# Patient Record
Sex: Female | Born: 1945 | Race: White | Hispanic: No | Smoking: Current every day smoker
Health system: Southern US, Community
[De-identification: ages and names within clinical notes are randomized; demographics above are authoritative.]

---

## 2000-12-01 ENCOUNTER — Encounter: Payer: Self-pay | Admitting: Family Medicine

## 2000-12-01 ENCOUNTER — Encounter: Admission: RE | Admit: 2000-12-01 | Discharge: 2000-12-01 | Payer: Self-pay | Admitting: Family Medicine

## 2001-12-04 ENCOUNTER — Encounter: Payer: Self-pay | Admitting: Family Medicine

## 2001-12-04 ENCOUNTER — Encounter: Admission: RE | Admit: 2001-12-04 | Discharge: 2001-12-04 | Payer: Self-pay | Admitting: Family Medicine

## 2003-02-28 ENCOUNTER — Encounter: Admission: RE | Admit: 2003-02-28 | Discharge: 2003-02-28 | Payer: Self-pay | Admitting: Family Medicine

## 2003-02-28 ENCOUNTER — Encounter: Payer: Self-pay | Admitting: Family Medicine

## 2004-09-15 ENCOUNTER — Encounter: Admission: RE | Admit: 2004-09-15 | Discharge: 2004-09-15 | Payer: Self-pay | Admitting: Family Medicine

## 2004-09-17 ENCOUNTER — Ambulatory Visit: Payer: Self-pay | Admitting: Family Medicine

## 2004-11-19 ENCOUNTER — Ambulatory Visit: Payer: Self-pay | Admitting: Family Medicine

## 2004-12-14 ENCOUNTER — Ambulatory Visit (HOSPITAL_COMMUNITY): Admission: RE | Admit: 2004-12-14 | Discharge: 2004-12-14 | Payer: Self-pay | Admitting: Gastroenterology

## 2004-12-14 ENCOUNTER — Encounter (INDEPENDENT_AMBULATORY_CARE_PROVIDER_SITE_OTHER): Payer: Self-pay | Admitting: Specialist

## 2005-01-26 ENCOUNTER — Ambulatory Visit: Payer: Self-pay | Admitting: Family Medicine

## 2005-07-19 ENCOUNTER — Ambulatory Visit: Payer: Self-pay | Admitting: Family Medicine

## 2008-02-08 ENCOUNTER — Encounter: Admission: RE | Admit: 2008-02-08 | Discharge: 2008-02-08 | Payer: Self-pay | Admitting: Family Medicine

## 2010-01-15 ENCOUNTER — Ambulatory Visit (HOSPITAL_COMMUNITY): Admission: RE | Admit: 2010-01-15 | Discharge: 2010-01-15 | Payer: Self-pay | Admitting: Family Medicine

## 2011-01-29 NOTE — Op Note (Signed)
NAMENAOMIA, LENDERMAN NO.:  [id number]   MEDICAL RECORD NO.:  [id number]          PATIENT TYPE:  AMB   LOCATION:  ENDO                         FACILITY:  [facility]   PHYSICIAN:  Petra Kuba, M.D.    DATE OF BIRTH:  02-Feb-1946   DATE OF PROCEDURE:  12/14/2004  DATE OF DISCHARGE:                                 OPERATIVE REPORT   PROCEDURE PERFORMED:  Colonoscopy with polypectomy.   ENDOSCOPIST:  Petra Kuba, M.D.   INDICATIONS FOR PROCEDURE:  Screening.   Consent was signed after the risks, benefits, methods and options were  thoroughly discussed in the office.   MEDICINES USED:  Demerol 75 mg, Versed 7.5 mg.   DESCRIPTION OF PROCEDURE:  Rectal inspection was pertinent for external  hemorrhoids, small.  Digital exam was negative.  A video pediatric  adjustable colonoscope was inserted and with rolling her on her back and  abdominal pressure, fairly easily able to advance around the colon to the  cecum.  On insertion a mid sized, midsigmoid, moderate sized diverticula  were seen.  A midsigmoid moderate sized pedunculated polyp was seen but no  other abnormalities.  The cecum was identified by the appendiceal orifice  and the ileocecal valve.  The scope was slowly withdrawn.  The prep was  adequate.  There was some liquid stool that required washing and suctioning.  On slow withdrawal back through the colon, the cecum, ascending, transverse  and descending were normal.  The scope was slowly withdrawn back to the  polyp seen on insertion which was snared, electrocautery applied.  Polyp was  removed, resnared suctioned onto the head of the scope and the polyp was  recovered.  Scope was reinserted and advanced to the polypectomy site.  It  had a nice white coagulum without any obvious residual polypoid tissue. The  scope was slowly withdrawn.  No additional findings were seen as we slowly  withdrew back to the rectum.  Anorectal pull through and retroflexion  confirmed some small hemorrhoids.  Scope was straightened, readvanced a  short ways up the left side of the colon, air was suctioned, scope removed.  The patient tolerated the procedure well.  There was no immediate obvious  complication.   ENDOSCOPIC DIAGNOSIS:  1.  Internal and external hemorrhoids.  2.  Midsigmoid moderate sized, pedunculated polyp, status post snare.  3.  Otherwise within normal limits to the cecum.   PLAN:  Await pathology to determine the future of colonic screening but  probably recheck in three years.  Happy to see back p.r.n.  Otherwise return  care to Dr. Lysbeth Galas for the customary health care maintenance to include  yearly rectals and guaiacs.      MEM/MEDQ  D:  12/14/2004  T:  12/14/2004  Job:  [id number]   cc:   Delaney Meigs, M.D.  [address]  [place]  [place] [address]  Fax: [phone]

## 2011-11-18 IMAGING — US US ABDOMEN COMPLETE
2 series · 14 of 25 positions shown · non-contrast
Comparison: None

CLINICAL DATA: Right upper quadrant pain.

COMPLETE ABDOMINAL ULTRASOUND

[Series 1: us abdomen complete · 0.27mm/px · 11 of 57 slices shown (1 of 2)]
[im 1/57]
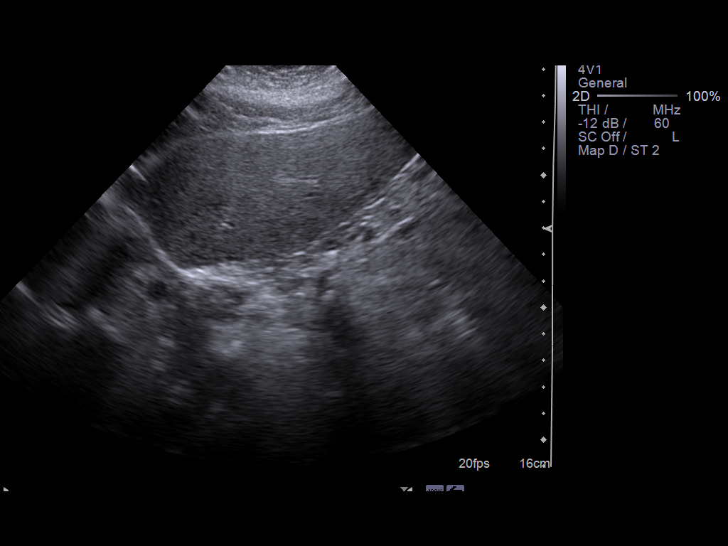
[im 6/57]
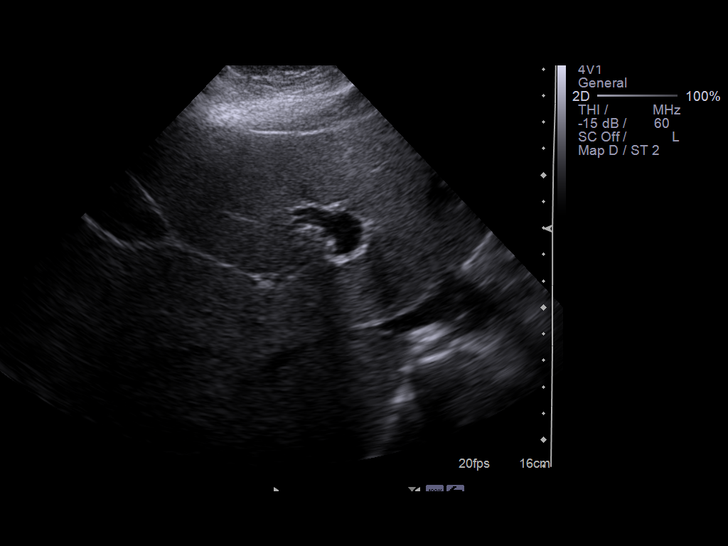
[im 12/57]
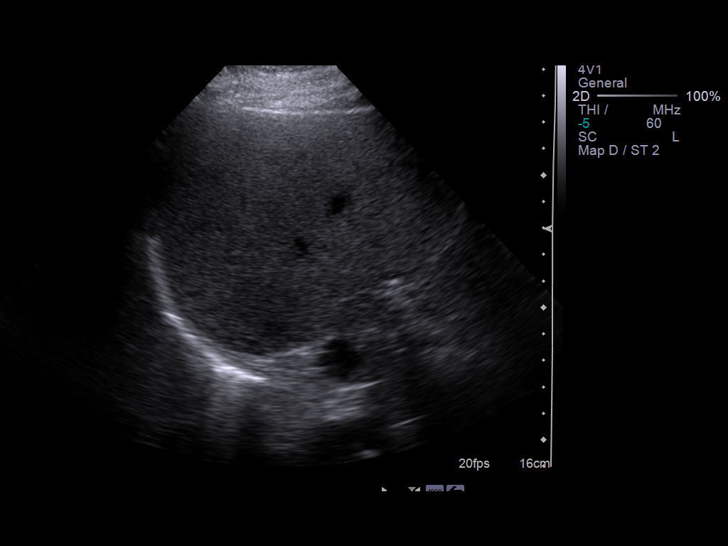
[im 18/57]
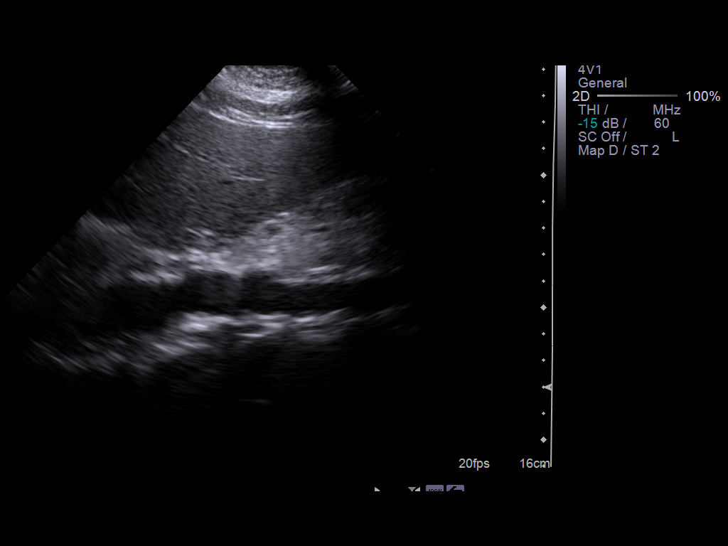
[im 24/57]
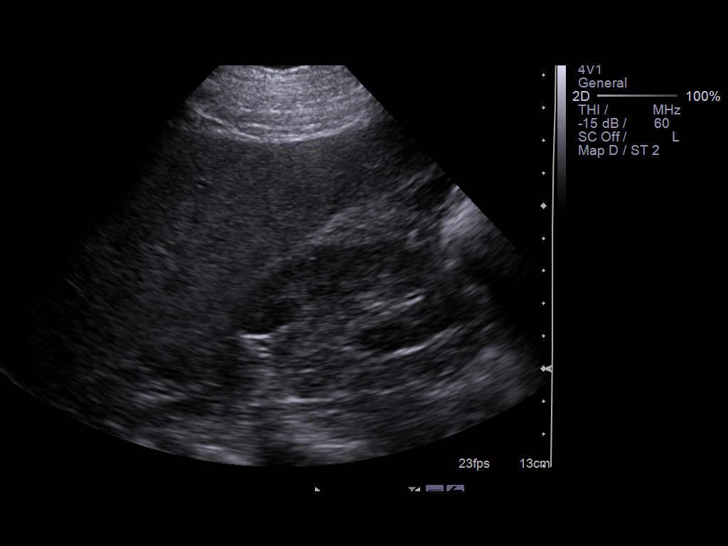
[im 27/57]
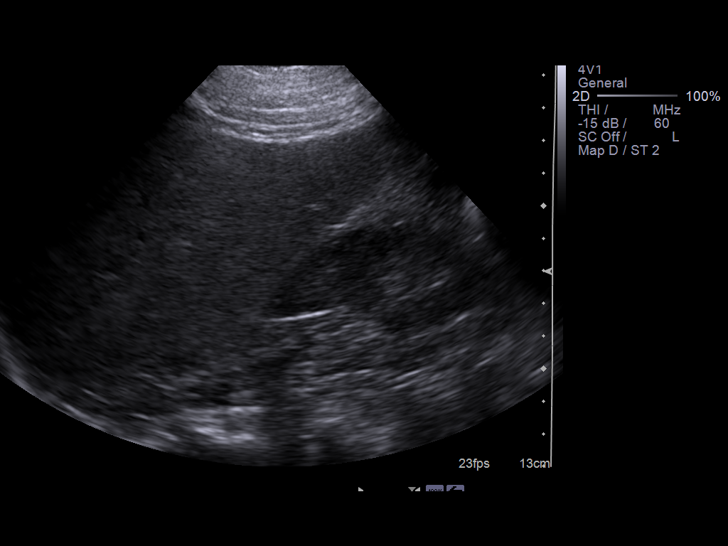
[im 33/57]
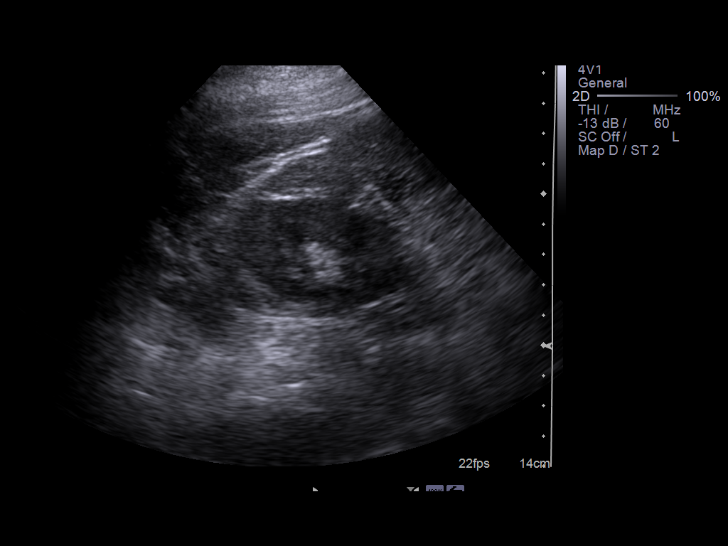
[im 39/57]
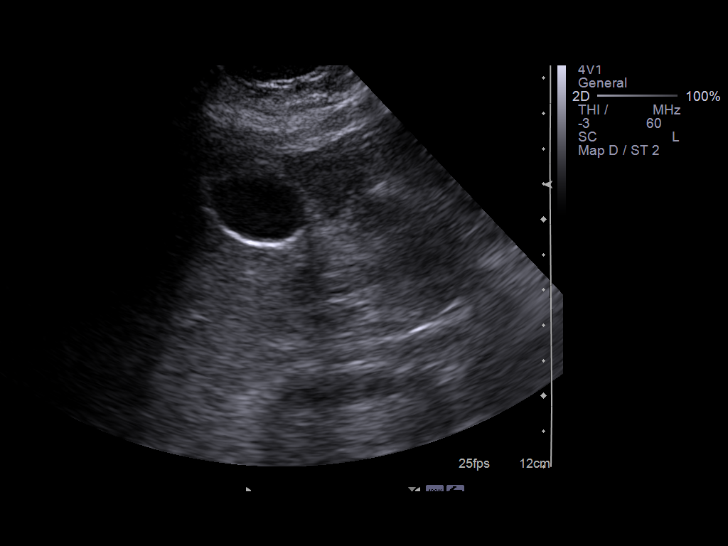
[im 45/57]
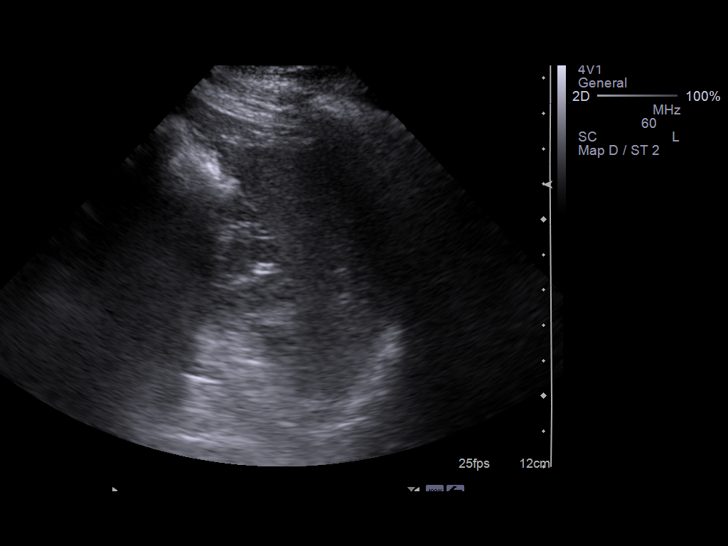
[im 48/57]
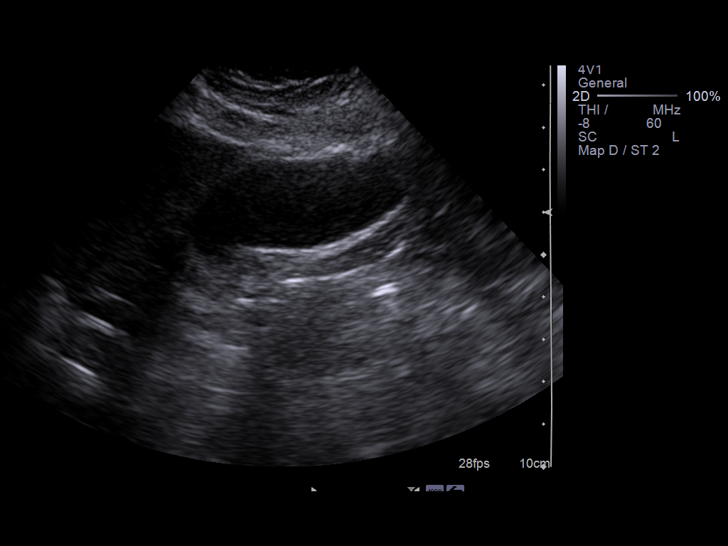
[im 54/57]
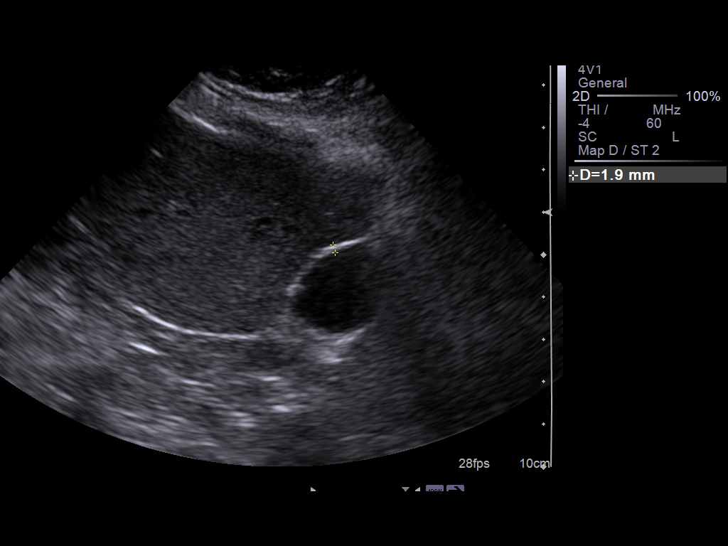

[Series 2: us abdomen complete · 0.28mm/px · 3 of 14 slices shown (2 of 2)]
[im 1/14]
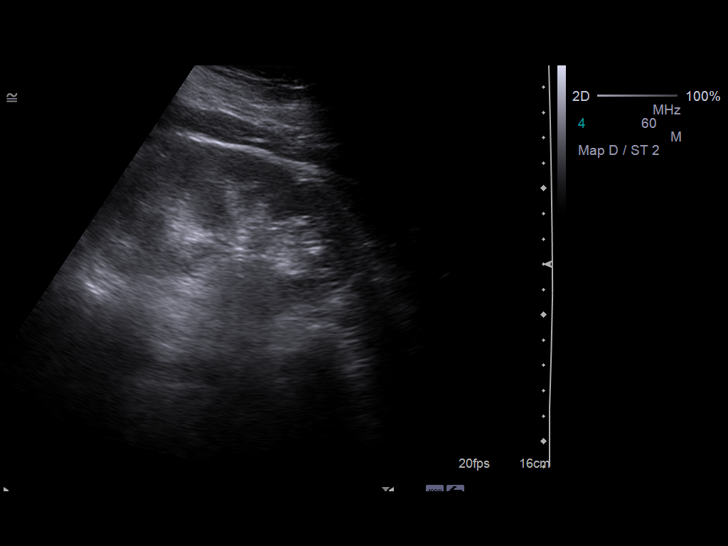
[im 7/14]
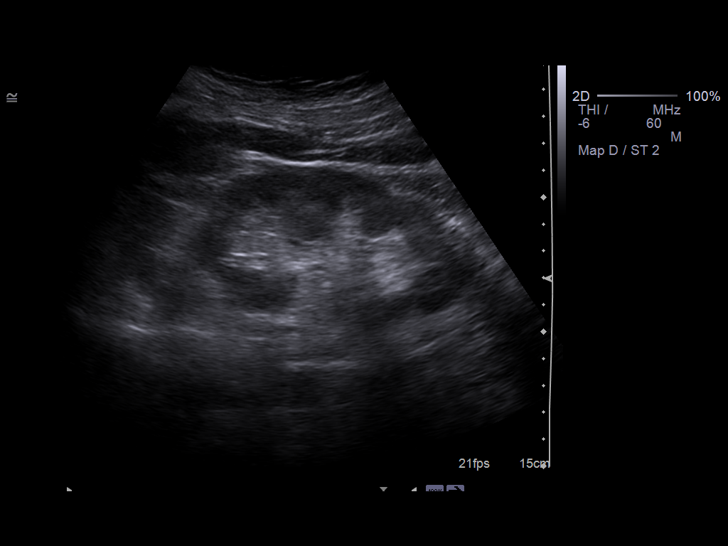
[im 14/14]
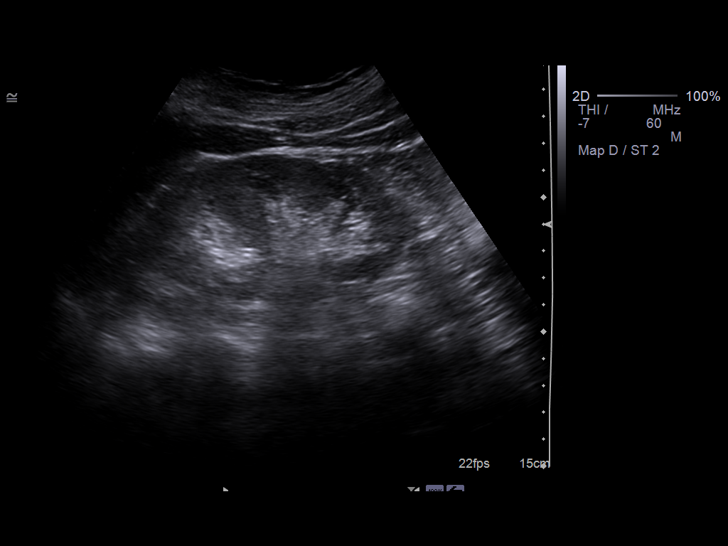

[14 of 25 positions shown; findings below may reference images not displayed]

FINDINGS: Gallbladder:  No gallstones, gallbladder wall thickening, or
pericholecystic fluid.

Common bile duct:  3.9 mm

Liver:  No focal lesion identified.  Within normal limits in
parenchymal echogenicity.

IVC:  Appears normal.

Pancreas:  No focal abnormality seen.

Spleen:  5.2 cm in length

Right Kidney:  11.1 cm in length without obstruction or mass.

Left Kidney:  11.2 cm in length without obstruction or mass.

Abdominal aorta:  No aneurysm identified.
IMPRESSION: Negative abdominal ultrasound.

## 2012-08-29 ENCOUNTER — Other Ambulatory Visit: Payer: Self-pay | Admitting: Family Medicine

## 2012-08-29 DIAGNOSIS — Z1231 Encounter for screening mammogram for malignant neoplasm of breast: Secondary | ICD-10-CM

## 2012-08-31 ENCOUNTER — Other Ambulatory Visit: Payer: Self-pay | Admitting: Family Medicine

## 2012-08-31 DIAGNOSIS — N63 Unspecified lump in unspecified breast: Secondary | ICD-10-CM

## 2012-08-31 DIAGNOSIS — N644 Mastodynia: Secondary | ICD-10-CM

## 2012-09-18 ENCOUNTER — Ambulatory Visit
Admission: RE | Admit: 2012-09-18 | Discharge: 2012-09-18 | Disposition: A | Discharge status: Home / Self Care | Payer: Medicare Other | Source: Ambulatory Visit | Attending: Family Medicine | Admitting: Family Medicine

## 2012-09-18 DIAGNOSIS — N644 Mastodynia: Secondary | ICD-10-CM

## 2012-09-18 DIAGNOSIS — N63 Unspecified lump in unspecified breast: Secondary | ICD-10-CM

## 2013-08-22 ENCOUNTER — Encounter: Payer: Self-pay | Admitting: Hematology & Oncology

## 2013-09-11 ENCOUNTER — Ambulatory Visit (HOSPITAL_COMMUNITY): Payer: Medicare Other

## 2013-09-24 ENCOUNTER — Telehealth: Payer: Self-pay | Admitting: Hematology & Oncology

## 2013-09-24 ENCOUNTER — Other Ambulatory Visit: Payer: Self-pay | Admitting: *Deleted

## 2013-09-24 NOTE — Telephone Encounter (Signed)
Left vm w NEW PATIENT today to remind them of their appointment with Dr. Ennever. Also, advised them to bring all meds and insurance information. ° °

## 2013-09-25 ENCOUNTER — Encounter: Payer: Self-pay | Admitting: Hematology & Oncology

## 2013-09-25 ENCOUNTER — Ambulatory Visit: Payer: Medicare Other

## 2013-09-25 ENCOUNTER — Ambulatory Visit (HOSPITAL_BASED_OUTPATIENT_CLINIC_OR_DEPARTMENT_OTHER): Payer: Medicare Other | Admitting: Hematology & Oncology

## 2013-09-25 ENCOUNTER — Other Ambulatory Visit (HOSPITAL_BASED_OUTPATIENT_CLINIC_OR_DEPARTMENT_OTHER): Payer: Medicare Other | Admitting: Lab

## 2013-09-25 VITALS — BP 122/57 | HR 80 | Temp 98.1°F | Resp 14 | Ht 60.0 in | Wt 167.0 lb

## 2013-09-25 DIAGNOSIS — D72829 Elevated white blood cell count, unspecified: Secondary | ICD-10-CM

## 2013-09-25 DIAGNOSIS — D751 Secondary polycythemia: Secondary | ICD-10-CM

## 2013-09-25 DIAGNOSIS — D649 Anemia, unspecified: Secondary | ICD-10-CM

## 2013-09-25 LAB — CBC WITH DIFFERENTIAL (CANCER CENTER ONLY)
BASO#: 0.1 10*3/uL (ref 0.0–0.2)
BASO%: 0.7 % (ref 0.0–2.0)
EOS ABS: 0.5 10*3/uL (ref 0.0–0.5)
EOS%: 5.6 % (ref 0.0–7.0)
HCT: 45.1 % (ref 34.8–46.6)
HGB: 14.6 g/dL (ref 11.6–15.9)
LYMPH#: 2.4 10*3/uL (ref 0.9–3.3)
LYMPH%: 26.6 % (ref 14.0–48.0)
MCH: 31.9 pg (ref 26.0–34.0)
MCHC: 32.4 g/dL (ref 32.0–36.0)
MCV: 99 fL (ref 81–101)
MONO#: 0.4 10*3/uL (ref 0.1–0.9)
MONO%: 4 % (ref 0.0–13.0)
NEUT%: 63.1 % (ref 39.6–80.0)
NEUTROS ABS: 5.6 10*3/uL (ref 1.5–6.5)
Platelets: 199 10*3/uL (ref 145–400)
RBC: 4.57 10*6/uL (ref 3.70–5.32)
RDW: 15.5 % (ref 11.1–15.7)
WBC: 8.9 10*3/uL (ref 3.9–10.0)

## 2013-09-25 LAB — IRON AND TIBC CHCC
%SAT: 22 % (ref 21–57)
Iron: 84 ug/dL (ref 41–142)
TIBC: 390 ug/dL (ref 236–444)
UIBC: 306 ug/dL (ref 120–384)

## 2013-09-25 LAB — CHCC SATELLITE - SMEAR

## 2013-09-25 LAB — FERRITIN CHCC: Ferritin: 65 ng/ml (ref 9–269)

## 2013-09-26 LAB — LACTATE DEHYDROGENASE: LDH: 218 U/L (ref 94–250)

## 2013-09-26 LAB — ERYTHROPOIETIN: Erythropoietin: 39.7 m[IU]/mL — ABNORMAL HIGH (ref 2.6–18.5)

## 2013-09-26 NOTE — Progress Notes (Signed)
This office note has been dictated.

## 2013-09-28 NOTE — Progress Notes (Signed)
DIAGNOSIS:  Transient leukocytosis/erythrocytosis.  HISTORY OF PRESENT ILLNESS:  Donna Stokes is very nice 68 year old white female.  She still looks younger than 68 years old.  She has multiple medical issues.  She has COPD.  I think this is pretty bad COPD.  It looks like she has been on the steroids on occasion.  She has hypertension.  Has hyperlipidemia.  She has had carotid endarterectomy.  She, again, followed by Dr. Edrick Oh.  He noted that she had some abnormal blood work recently. Back in November of 2014, a CBC was done.  This showed a white count 16.7, hemoglobin 16.8, hematocrit 48.7, platelet count was 203.  MCV was 96.  White cell differential showed 91 segs, 7 lymphocytes, and 2 monos.  I have to suspect that she was on some steroids at that time.  Then her labs were repeated a week later so.  This now showed a white count 11.8, hemoglobin 17.9, hematocrit 51.9, and platelet count was 181.  MCV was 93.  She had normal electrolytes.  Because of the abnormalities with her labs count, Dr. Edrick Oh felt that a hematologic evaluation was needed.  As such, he kindly referred to the [facility] for an evaluation.  She said that she feels okay.  Her lungs surely did give her problems. She has a lot of coughing, wheezing when we saw her.  She has had no weight loss or weight gain.  She has had no change in her medications.  She has had no change in bowel or bladder habits.  She had a colonoscopy within the past 5 years.  She definitely gets her mammograms.  She did have a myocardial infarction back in 2000.  She did have a CVA.  This is from 2004.  She has not noted any obvious bleeding.  She has had no dysphagia or odynophagia.  She has had occasional headache.  __________ she still smoking.  PAST MEDICAL HISTORY:  Remarkable for: 1. Hypertension. 2. Coronary artery disease. 3  Cerebrovascular accident. 1. Chronic obstructive pulmonary disease. 2.  Hyperlipidemia. 3. Carotid endarterectomy. 4. Gastroesophageal reflux disease.  ALLERGIES:  None.  MEDICATIONS:  Elavil 25 mg p.o. q.h.s., aspirin 162 mg p.o. daily, Plavix 75 mg p.o. daily, gabapentin 600 mg p.o. daily, hydrochlorothiazide 25 mg p.o. daily, Cozaar 50 mg p.o. daily, Lopressor 25 mg p.o. q.a.m., Crestor 20 mg p.o. daily.  SOCIAL HISTORY:  Remarkable for tobacco use.  She probably has about a 50-pack-year history of tobacco use.  She has smoked about pack a day. There is no alcohol use.  She has no obvious occupational exposures.  FAMILY HISTORY:  Noncontributory for any hematologic issue.  REVIEW OF SYSTEMS:  As stated in history of present illness.  No additional findings are noted on a 12-system review.  PHYSICAL EXAMINATION:  General:  __________ appearing white female, in no obvious distress.  She is alert and oriented to x3.  Vital Signs: Temperature of 98.1, pulse 80, respiratory rate 14, blood pressure 122/57, weight is 167 pounds.  Head and Neck:  Normocephalic, atraumatic skull.  There are no ocular or oral lesions.  She has no palpable, cervical, or supraclavicular lymph nodes.  Lungs:  Decreased breath sounds throughout both lung fields.  She has some crackles bilaterally. Cardiac:  Regular rate and rhythm with normal S1 and S2.  She has an occasional extra beat.  No murmurs, rubs, or bruits are noted.  Abdomen: Soft.  She has good bowel sounds.  There is  no fluid wave.  There is no palpable abdominal mass.  There is no palpable hepatosplenomegaly. Axillary:  No bilateral axillary adenopathy.  Back:  No kyphosis.  She has no osteoporotic changes.  There is no tenderness over the spine, ribs, or hips.  Extremities:  No clubbing, cyanosis, or edema.  She has good range motion of her joints.  She has no joint swelling, erythema, or warmth.  Skin:  No rashes, ecchymoses or petechia.  Neurological:  No focal neurological deficits.  LABORATORY STUDIES:   White cell count is 8.9, hemoglobin 14.6, hematocrit 45.1, platelet count 199.  __________ open level is 40.  Ferritin is 65 with iron saturation of 22%.  LDH is 218.  Peripheral smear shows a normochromic, normocytic population of red blood cells.  She has no nucleated red blood cells.  I see no teardrop cells.  She has no target cells.  I see no teardrop cells.  She has no schistocytes or spherocytes.  White cells appear normal in morphology and maturation.  She has no immature myeloid or lymphoid forms.  She has no hypersegmented polys.  There is no blasts.  Platelets are adequate in number and size.  IMPRESSION:  Donna Stokes is a very charming 68 year old white female. She had transient leukocytosis and erythrocytosis.  I think a lot of this is reactive.  She does have some COPD.  It is possible that her hemoglobin is being driven by increased carbon dioxide.  This certainly is seen in patient's who have underlying COPD.  They get a secondary polycythemia.  Her blood counts certainly look good today.  Her blood smear looks okay.  I do not see any obvious bone marrow disorder that could have cause this.  I would have thought that, if she had any myeloproliferative process, that her blood counts would not improve.  I think that she stopped smoking, this certainly could help her out.  I do not see need for a bone marrow biopsy in this situation.  I think this would be very low yield.  The fact that her retrograde level is normal, and her iron studies look okay, this would go against any myeloproliferative process.  I spent a good hour or so with Donna Stokes.  She is very nice.  I am glad that her blood counts are normal and that I do not see anything on her physical exam, or blood smear that would suggest a problem.  I did go ahead and send off a JAK2 assay on her.  I just want to be thorough regarding a myeloproliferative process.  If, for some recovery with JAK2 assay  is positive, then we are going to have to get a bone marrow test on her.  I just do not see that we have to get her back to the office for right now.  I just do not think, we would be adding much to her medical care.    ______________________________ Volanda Napoleon, M.D. PRE/MEDQ  D:  09/26/2013  T:  09/27/2013  Job:  [id number]

## 2013-10-10 ENCOUNTER — Telehealth: Payer: Self-pay | Admitting: Nurse Practitioner

## 2013-10-10 NOTE — Telephone Encounter (Addendum)
Message copied by Glee ArvinPICKENPACK-COUSAR, Ranald Alessio N on Wed Oct 10, 2013  1:30 PM ------      Message from: Arlan OrganENNEVER, PETER R      Created: Sun Oct 07, 2013  2:20 PM       Call - ALL special tests that we ran are NORMAL!! i do NOT see any bone marrow issue!!  Cindee LamePete ------Pt verbalized understanding and appreciation.

## 2013-12-25 ENCOUNTER — Other Ambulatory Visit: Payer: Self-pay

## 2013-12-25 DIAGNOSIS — Z1231 Encounter for screening mammogram for malignant neoplasm of breast: Secondary | ICD-10-CM

## 2014-01-21 ENCOUNTER — Ambulatory Visit
Admission: RE | Admit: 2014-01-21 | Discharge: 2014-01-21 | Disposition: A | Discharge status: Home / Self Care | Payer: Medicare Other | Source: Ambulatory Visit

## 2014-01-21 DIAGNOSIS — Z1231 Encounter for screening mammogram for malignant neoplasm of breast: Secondary | ICD-10-CM

## 2016-06-09 ENCOUNTER — Other Ambulatory Visit: Payer: Self-pay | Admitting: Family Medicine

## 2016-06-09 DIAGNOSIS — Z1231 Encounter for screening mammogram for malignant neoplasm of breast: Secondary | ICD-10-CM

## 2016-06-09 DIAGNOSIS — E2839 Other primary ovarian failure: Secondary | ICD-10-CM

## 2016-06-22 ENCOUNTER — Ambulatory Visit
Admission: RE | Admit: 2016-06-22 | Discharge: 2016-06-22 | Disposition: A | Discharge status: Home / Self Care | Payer: Medicare Other | Source: Ambulatory Visit | Attending: Family Medicine | Admitting: Family Medicine

## 2016-06-22 DIAGNOSIS — E2839 Other primary ovarian failure: Secondary | ICD-10-CM

## 2016-06-22 DIAGNOSIS — Z1231 Encounter for screening mammogram for malignant neoplasm of breast: Secondary | ICD-10-CM

## 2020-12-12 DEATH — deceased
